# Patient Record
Sex: Female | Born: 1985 | Race: Black or African American | Hispanic: No | Marital: Single | Smoking: Never smoker
Health system: Southern US, Community
[De-identification: ages and names within clinical notes are randomized; demographics above are authoritative.]

## PROBLEM LIST (undated history)

## (undated) DIAGNOSIS — I10 Essential (primary) hypertension: Secondary | ICD-10-CM

## (undated) DIAGNOSIS — D649 Anemia, unspecified: Secondary | ICD-10-CM

---

## 2018-10-31 ENCOUNTER — Encounter (HOSPITAL_COMMUNITY): Payer: Self-pay

## 2018-10-31 ENCOUNTER — Other Ambulatory Visit: Payer: Self-pay

## 2018-10-31 ENCOUNTER — Emergency Department (HOSPITAL_COMMUNITY)
Admission: EM | Admit: 2018-10-31 | Discharge: 2018-10-31 | Disposition: A | Discharge status: Home / Self Care | Payer: Self-pay | Attending: Emergency Medicine | Admitting: Emergency Medicine

## 2018-10-31 ENCOUNTER — Emergency Department (HOSPITAL_COMMUNITY): Payer: Self-pay

## 2018-10-31 DIAGNOSIS — M25572 Pain in left ankle and joints of left foot: Secondary | ICD-10-CM | POA: Insufficient documentation

## 2018-10-31 DIAGNOSIS — I10 Essential (primary) hypertension: Secondary | ICD-10-CM | POA: Insufficient documentation

## 2018-10-31 DIAGNOSIS — G8929 Other chronic pain: Secondary | ICD-10-CM

## 2018-10-31 DIAGNOSIS — M25571 Pain in right ankle and joints of right foot: Secondary | ICD-10-CM | POA: Insufficient documentation

## 2018-10-31 HISTORY — DX: Anemia, unspecified: D64.9

## 2018-10-31 HISTORY — DX: Essential (primary) hypertension: I10

## 2018-10-31 NOTE — ED Notes (Signed)
Reviewed discharge instructions, provided to patient, and she would not sign for discharge.

## 2018-10-31 NOTE — ED Notes (Signed)
Went to go discharge patient, she was upset that the provider, Eustaquio Maize, PA, would prescribe her pain medication besides the pain medication she is already prescribed. Patient requested to speak with the doctor over the physician assistant. Margaux, PA and myself spoke with Dr. Carin Hock about patients request to speak to him and pain medication. Dr. Melina Copa is extremely busy and unable to see patient at this moment. He stated he would not give her pain medication also. Spoke back with patient about the wait for Dr. Melina Copa, she stated she wanted to leave. This writer explained to patient that the emergency department could not prescribe narcotics that was unjustifiable with no no injury from x-rays. Then, patient wanted a taxi home, stating she has Medicaid and needs a ride home. Informed patient we do not provide taxi services for patient unless it is at the expense of the patient. She continues to want to leave but taking her time getting ready. Through out this visit, patient has been negative toward staff and degrading the hospital.
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
## 2018-11-17 ENCOUNTER — Emergency Department: Payer: MEDICAID

## 2018-11-17 ENCOUNTER — Inpatient Hospital Stay
Admit: 2018-11-17 | Discharge: 2018-11-17 | Discharge status: Against Medical Advice | Payer: MEDICAID | Attending: Emergency Medicine

## 2018-11-17 DIAGNOSIS — R102 Pelvic and perineal pain: Secondary | ICD-10-CM

## 2018-11-17 LAB — URINALYSIS W/ RFLX MICROSCOPIC
Bilirubin, Urine: NEGATIVE
Bilirubin: NEGATIVE
Glucose, Ur: NEGATIVE mg/dL
Glucose: NEGATIVE mg/dL
Leukocyte Esterase, Urine: NEGATIVE
Leukocyte Esterase: NEGATIVE
Nitrite, Urine: NEGATIVE
Nitrites: NEGATIVE
Protein, UA: NEGATIVE mg/dL
Protein: NEGATIVE mg/dL
Specific Gravity, UA: >1.03 NA — ABNORMAL HIGH (ref 1.005–1.030)
Specific gravity: >1.03 — ABNORMAL HIGH (ref 1.005–1.030)
Urobilinogen, UA, POCT: 1 EU/dL (ref 0.2–1.0)
Urobilinogen: 1 EU/dL (ref 0.2–1.0)
pH (UA): 5.5 (ref 5.0–8.0)
pH, UA: 5.5 (ref 5.0–8.0)

## 2018-11-17 LAB — CBC WITH AUTO DIFFERENTIAL
Basophils %: 0 % (ref 0–2)
Basophils Absolute: 0 10*3/uL (ref 0.0–0.1)
Eosinophils %: 4 % (ref 0–5)
Eosinophils Absolute: 0.2 10*3/uL (ref 0.0–0.4)
Hematocrit: 36.2 % (ref 35.0–45.0)
Hemoglobin: 11.5 g/dL — ABNORMAL LOW (ref 12.0–16.0)
Lymphocytes %: 30 % (ref 21–52)
Lymphocytes Absolute: 1.8 10*3/uL (ref 0.9–3.6)
MCH: 27.5 PG (ref 24.0–34.0)
MCHC: 31.8 g/dL (ref 31.0–37.0)
MCV: 86.6 FL (ref 74.0–97.0)
MPV: 11.3 FL (ref 9.2–11.8)
Monocytes %: 4 % (ref 3–10)
Monocytes Absolute: 0.2 10*3/uL (ref 0.05–1.2)
Neutrophils %: 62 % (ref 40–73)
Neutrophils Absolute: 3.7 10*3/uL (ref 1.8–8.0)
Platelets: 284 10*3/uL (ref 135–420)
RBC: 4.18 M/uL — ABNORMAL LOW (ref 4.20–5.30)
RDW: 14.7 % — ABNORMAL HIGH (ref 11.6–14.5)
WBC: 5.9 10*3/uL (ref 4.6–13.2)

## 2018-11-17 LAB — COMPREHENSIVE METABOLIC PANEL
ALT: 14 U/L (ref 13–56)
AST: 6 U/L — ABNORMAL LOW (ref 10–38)
Albumin/Globulin Ratio: 0.8 (ref 0.8–1.7)
Albumin: 3.3 g/dL — ABNORMAL LOW (ref 3.4–5.0)
Alkaline Phosphatase: 88 U/L (ref 45–117)
Anion Gap: 3 mmol/L (ref 3.0–18)
BUN: 13 MG/DL (ref 7.0–18)
Bun/Cre Ratio: 20 (ref 12–20)
CO2: 26 mmol/L (ref 21–32)
Calcium: 8.3 MG/DL — ABNORMAL LOW (ref 8.5–10.1)
Chloride: 111 mmol/L (ref 100–111)
Creatinine: 0.66 MG/DL (ref 0.6–1.3)
EGFR IF NonAfrican American: >60 mL/min/{1.73_m2} (ref >60)
GFR African American: >60 mL/min/{1.73_m2} (ref >60)
Globulin: 4.2 g/dL — ABNORMAL HIGH (ref 2.0–4.0)
Glucose: 82 mg/dL (ref 74–99)
Potassium: 3.7 mmol/L (ref 3.5–5.5)
Sodium: 140 mmol/L (ref 136–145)
Total Bilirubin: 0.1 MG/DL — ABNORMAL LOW (ref 0.2–1.0)
Total Protein: 7.5 g/dL (ref 6.4–8.2)

## 2018-11-17 LAB — URINE MICROSCOPIC ONLY
Epithelial Cells, UA: NEGATIVE /lpf (ref 0–5)
Epithelial cells: NEGATIVE /lpf (ref 0–5)
RBC, UA: 0 /hpf (ref 0–5)
RBC: 0 /hpf (ref 0–5)
WBC, UA: NEGATIVE /hpf (ref 0–4)
WBC: NEGATIVE /hpf (ref 0–4)

## 2018-11-17 LAB — HCG QL SERUM
HCG, Ql.: NEGATIVE
HCG, Ql.: NEGATIVE

## 2018-11-17 LAB — WET PREP
Wet Prep: NONE SEEN
Wet prep: NONE SEEN

## 2018-11-17 LAB — CBC WITH AUTOMATED DIFF
ABS. BASOPHILS: 0 10*3/uL (ref 0.0–0.1)
ABS. EOSINOPHILS: 0.2 10*3/uL (ref 0.0–0.4)
ABS. LYMPHOCYTES: 1.8 10*3/uL (ref 0.9–3.6)
ABS. MONOCYTES: 0.2 10*3/uL (ref 0.05–1.2)
ABS. NEUTROPHILS: 3.7 10*3/uL (ref 1.8–8.0)
BASOPHILS: 0 % (ref 0–2)
EOSINOPHILS: 4 % (ref 0–5)
HCT: 36.2 % (ref 35.0–45.0)
HGB: 11.5 g/dL — ABNORMAL LOW (ref 12.0–16.0)
LYMPHOCYTES: 30 % (ref 21–52)
MCH: 27.5 PG (ref 24.0–34.0)
MCHC: 31.8 g/dL (ref 31.0–37.0)
MCV: 86.6 FL (ref 74.0–97.0)
MONOCYTES: 4 % (ref 3–10)
MPV: 11.3 FL (ref 9.2–11.8)
NEUTROPHILS: 62 % (ref 40–73)
PLATELET: 284 10*3/uL (ref 135–420)
RBC: 4.18 M/uL — ABNORMAL LOW (ref 4.20–5.30)
RDW: 14.7 % — ABNORMAL HIGH (ref 11.6–14.5)
WBC: 5.9 10*3/uL (ref 4.6–13.2)

## 2018-11-17 LAB — METABOLIC PANEL, COMPREHENSIVE
A-G Ratio: 0.8 (ref 0.8–1.7)
ALT (SGPT): 14 U/L (ref 13–56)
AST (SGOT): 6 U/L — ABNORMAL LOW (ref 10–38)
Albumin: 3.3 g/dL — ABNORMAL LOW (ref 3.4–5.0)
Alk. phosphatase: 88 U/L (ref 45–117)
Anion gap: 3 mmol/L (ref 3.0–18)
BUN/Creatinine ratio: 20 (ref 12–20)
BUN: 13 MG/DL (ref 7.0–18)
Bilirubin, total: 0.1 MG/DL — ABNORMAL LOW (ref 0.2–1.0)
CO2: 26 mmol/L (ref 21–32)
Calcium: 8.3 MG/DL — ABNORMAL LOW (ref 8.5–10.1)
Chloride: 111 mmol/L (ref 100–111)
Creatinine: 0.66 MG/DL (ref 0.6–1.3)
GFR est AA: >60 mL/min/{1.73_m2} (ref >60)
GFR est non-AA: >60 mL/min/{1.73_m2} (ref >60)
Globulin: 4.2 g/dL — ABNORMAL HIGH (ref 2.0–4.0)
Glucose: 82 mg/dL (ref 74–99)
Potassium: 3.7 mmol/L (ref 3.5–5.5)
Protein, total: 7.5 g/dL (ref 6.4–8.2)
Sodium: 140 mmol/L (ref 136–145)

## 2018-11-17 MED ORDER — MORPHINE 2 MG/ML INJECTION
2 mg/mL | INTRAMUSCULAR | Status: DC
Start: 2018-11-17 — End: 2018-11-17

## 2018-11-17 MED ORDER — IOPAMIDOL 61 % IV SOLN
300 mg iodine /mL (61 %) | Freq: Once | INTRAVENOUS | Status: DC
Start: 2018-11-17 — End: 2018-11-17

## 2018-11-17 MED FILL — MORPHINE 2 MG/ML INJECTION: 2 mg/mL | INTRAMUSCULAR | Qty: 1

## 2018-11-17 MED FILL — ISOVUE-300  61 % INTRAVENOUS SOLUTION: 300 mg iodine /mL (61 %) | INTRAVENOUS | Qty: 100
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
## 2018-11-17 NOTE — ED Notes (Signed)
Patient informed PA Kizzie Bane that she was not staying to complete care. When asked why patient stated she "wasn't gong to stay all night, Ill go somewhere that an do something"

## 2018-11-17 NOTE — ED Notes (Signed)
Patient refusing to provide urine sample. Provider aware.

## 2018-11-17 NOTE — ED Notes (Signed)
Attempted to assist patient to stretcher and into gown. Patient stated that this nurse "have no idea what you talking about. Get that other lady here and leave me the f alone". Provider and charge nurse made aware
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
## 2018-11-17 NOTE — ED Notes (Signed)
Attempted to assist patient to stretcher and into gown. Patient stated that this nurse "have no idea what you talking about. Get that other lady here and leave me the f alone". Provider and charge nurse made aware

## 2018-11-17 NOTE — ED Notes (Signed)
Patient refusing to provide urine sample. Provider aware.

## 2018-11-17 NOTE — ED Notes (Signed)
Patient informed PA Hughes that she was not staying to complete care. When asked why patient stated she "wasn't gong to stay all night, Ill go somewhere that an do something"
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
## 2018-11-21 LAB — C.TRACHOMATIS N.GONORRHOEAE DNA
Chlamydia trachomatis, NAA: NEGATIVE
Neisseria Gonorrhoeae, NAA: NEGATIVE

## 2018-11-21 LAB — CHLAMYDIA/NEISSERIA AMPLIFICATION
Chlamydia amplification: NEGATIVE
N. gonorrhoeae amplification: NEGATIVE

## 2019-03-11 ENCOUNTER — Emergency Department: Admit: 2019-03-11

## 2019-03-11 ENCOUNTER — Inpatient Hospital Stay
Admit: 2019-03-11 | Discharge: 2019-03-11 | Disposition: A | Discharge status: Home / Self Care | Attending: Emergency Medicine

## 2019-03-11 DIAGNOSIS — J45901 Unspecified asthma with (acute) exacerbation: Secondary | ICD-10-CM

## 2019-03-11 MED ORDER — ALBUTEROL SULFATE HFA 90 MCG/ACTUATION AEROSOL INHALER
90 mcg/actuation | RESPIRATORY_TRACT | 0 refills | Status: AC | PRN
Start: 2019-03-11 — End: ?

## 2019-03-11 MED ORDER — LIDOCAINE 5 % (700 MG/PATCH) ADHESIVE PATCH
5 % | CUTANEOUS | 0 refills | Status: AC
Start: 2019-03-11 — End: ?

## 2019-03-11 MED ORDER — ACETAMINOPHEN 500 MG TAB
500 mg | Freq: Once | ORAL | Status: AC
Start: 2019-03-11 — End: 2019-03-11
  Administered 2019-03-11: 10:00:00 via ORAL

## 2019-03-11 MED ORDER — PREDNISONE 50 MG TAB
50 mg | ORAL_TABLET | Freq: Every day | ORAL | 0 refills | Status: AC
Start: 2019-03-11 — End: 2019-03-16

## 2019-03-11 MED ORDER — IPRATROPIUM-ALBUTEROL 2.5 MG-0.5 MG/3 ML NEB SOLUTION
2.5 mg-0.5 mg/3 ml | RESPIRATORY_TRACT | Status: AC
Start: 2019-03-11 — End: 2019-03-11
  Administered 2019-03-11: 10:00:00 via RESPIRATORY_TRACT

## 2019-03-11 MED ORDER — ACETAMINOPHEN 500 MG TAB
500 mg | ORAL_TABLET | Freq: Three times a day (TID) | ORAL | 0 refills | Status: AC | PRN
Start: 2019-03-11 — End: ?

## 2019-03-11 MED ORDER — LIDOCAINE 4 % TOPICAL PATCH (12 HOUR DURATION)
4 % | CUTANEOUS | Status: DC
Start: 2019-03-11 — End: 2019-03-11

## 2019-03-11 MED ORDER — PREDNISONE 20 MG TAB
20 mg | Freq: Once | ORAL | Status: AC
Start: 2019-03-11 — End: 2019-03-11
  Administered 2019-03-11: 10:00:00 via ORAL

## 2019-03-11 MED FILL — ASPERCREME (LIDOCAINE) 4 % TOPICAL PATCH: 4 % | CUTANEOUS | Qty: 1

## 2019-03-11 MED FILL — IPRATROPIUM-ALBUTEROL 2.5 MG-0.5 MG/3 ML NEB SOLUTION: 2.5 mg-0.5 mg/3 ml | RESPIRATORY_TRACT | Qty: 3

## 2019-03-11 MED FILL — MAPAP EXTRA STRENGTH 500 MG TABLET: 500 mg | ORAL | Qty: 2

## 2019-03-11 MED FILL — PREDNISONE 20 MG TAB: 20 mg | ORAL | Qty: 3

## 2019-03-11 NOTE — ED Notes (Signed)
Pt brought in via EMS with c/o "asthma" flare up;  Pt sts her inhaler is not working;  C/o cough for 2 days;  Pt admits to being a smoker;  Pt also c/o left shoulder pain that started when she woke up yesterday;

## 2019-03-11 NOTE — ED Notes (Signed)
Assumed care at discharge  Pt discharged home stable and ambulatory.   Pain level at discharge pt sleeping upon pt being woken up pt sts pain in her neck 8/10.  Pt discharged with self.  Reviewed discharged instructions with pt who verbalized understanding.  Patient armband removed and shredded  Written rx x's 4

## 2019-03-11 NOTE — ED Provider Notes (Signed)
EMERGENCY DEPARTMENT HISTORY AND PHYSICAL EXAM    Date: 03/11/2019  Patient Name: Kristy Lawson    History of Presenting Illness     Chief Complaint   Patient presents with   ??? Wheezing         History Provided By: Patient    Kristy Lawson is a 33 y.o. female with PMHX of asthma who presents to the emergency department C/O wheezing.    Patient tells me she was walking this morning to a friend's house when she began feeling short of breath and wheezing.  She used her inhaler however does not have outpatient psychiatric breath however when she resumed walking she again felt short of breath.  Patient called EMS and they brought her to the emergency department.  She denies prior history of admissions for asthma.  Patient smokes tobacco.  She is also complaining of left shoulder pain she attributes to a ligament injury and thoracic back pain.    PCP: None    Current Facility-Administered Medications   Medication Dose Route Frequency Provider Last Rate Last Dose   ??? lidocaine 4 % patch 1 Patch  1 Patch TransDERmal Q24H Gerarda Gunther, MD   1 Patch at 03/11/19 0981     Current Outpatient Medications   Medication Sig Dispense Refill   ??? albuterol (PROVENTIL HFA, VENTOLIN HFA, PROAIR HFA) 90 mcg/actuation inhaler Take 2 Puffs by inhalation every four (4) hours as needed for Wheezing. 1 Inhaler 0   ??? [START ON 03/12/2019] predniSONE (DELTASONE) 50 mg tablet Take 1 Tab by mouth daily for 4 days. 4 Tab 0   ??? acetaminophen (TYLENOL) 500 mg tablet Take 2 Tabs by mouth every eight (8) hours as needed for Pain. 30 Tab 0   ??? lidocaine (Lidoderm) 5 % Apply patch to the affected area for 12 hours a day and remove for 12 hours a day. 15 Each 0       Past History     Past Medical History:  Past Medical History:   Diagnosis Date   ??? Arthritis    ??? Asthma    ??? Hypertension        Past Surgical History:  History reviewed. No pertinent surgical history.    Family History:  History reviewed. No pertinent family  history.    Social History:  Social History     Tobacco Use   ??? Smoking status: Current Every Day Smoker   ??? Smokeless tobacco: Never Used   Substance Use Topics   ??? Alcohol use: Not on file   ??? Drug use: Not on file       Allergies:  Allergies   Allergen Reactions   ??? Ibuprofen Hives   ??? Penicillins Hives   ??? Tape [Adhesive] Rash         Review of Systems   Review of Systems   Constitutional: Negative for chills and fever.   Respiratory: Positive for cough, chest tightness, shortness of breath and wheezing.    Cardiovascular: Negative for chest pain.   Gastrointestinal: Negative for nausea and vomiting.   Musculoskeletal: Positive for arthralgias (chronic left shoulder apin) and back pain (chronic upper left back pain).   All other systems reviewed and are negative.        Physical Exam     Vitals:    03/11/19 0354 03/11/19 0515 03/11/19 0633   BP: 115/72  118/68   Pulse: 86  78   Resp: 18  16   Temp:  98.6 ??F (37 ??C)     SpO2: 98% 99% 99%   Weight: 80.5 kg (177 lb 6.4 oz)     Height: 5\' 1"  (1.549 m)       Physical Exam  Vitals signs and nursing note reviewed.   Constitutional:       General: She is not in acute distress.     Appearance: Normal appearance. She is well-developed. She is not ill-appearing.      Comments: sleeping   HENT:      Head: Normocephalic and atraumatic.   Eyes:      Extraocular Movements: Extraocular movements intact.      Conjunctiva/sclera: Conjunctivae normal.      Pupils: Pupils are equal, round, and reactive to light.   Neck:      Musculoskeletal: Normal range of motion and neck supple.   Cardiovascular:      Rate and Rhythm: Normal rate and regular rhythm.      Pulses: Normal pulses.      Heart sounds: Normal heart sounds.   Pulmonary:      Effort: Pulmonary effort is normal. No respiratory distress.      Breath sounds: Wheezing present. No rales.      Comments: Scattered mild wheezing  Chest:      Chest wall: No tenderness.   Abdominal:      General: There is no distension.       Palpations: Abdomen is soft.      Tenderness: There is no abdominal tenderness. There is no guarding or rebound.   Musculoskeletal: Normal range of motion.         General: No tenderness.   Lymphadenopathy:      Cervical: No cervical adenopathy.   Skin:     General: Skin is warm and dry.   Neurological:      General: No focal deficit present.      Mental Status: She is alert and oriented to person, place, and time.      Cranial Nerves: No cranial nerve deficit.      Motor: No abnormal muscle tone.      Coordination: Coordination normal.   Psychiatric:         Mood and Affect: Mood normal.         Behavior: Behavior normal.         Diagnostic Study Results     Labs -   No results found for this or any previous visit (from the past 12 hour(s)).    Radiologic Studies -   XR CHEST PORT    (Results Pending)     CT Results  (Last 48 hours)    None        CXR Results  (Last 48 hours)    None          Medications given in the ED-  Medications   lidocaine 4 % patch 1 Patch (1 Patch TransDERmal Apply Patch 03/11/19 0511)   albuterol-ipratropium (DUO-NEB) 2.5 MG-0.5 MG/3 ML (3 mL Nebulization Given 03/11/19 0513)   acetaminophen (TYLENOL) tablet 1,000 mg (1,000 mg Oral Given 03/11/19 0509)   predniSONE (DELTASONE) tablet 60 mg (60 mg Oral Given 03/11/19 0509)         Medical Decision Making   I am the first provider for this patient.    I reviewed the vital signs, available nursing notes, past medical history, past surgical history, family history and social history.    Vital Signs-Reviewed the patient's vital signs.  Pulse Oximetry Analysis and Interpretation:   98% on RA, normal        CXR  interpretation: (Preliminary)  CXR read by Dr. Drenda Freeze    No acute findings      Records Reviewed: Nursing Notes    Provider Notes (Medical Decision Making): Kristy Lawson is a 33 y.o. female here for wheezing, satting adequately on room air lung fields clear we will plan on breathing treatments, steroids anticipate  discharge    Procedures:  Procedures    ED Course:   SMOKING CESSATION:  5:11 AM  The patient was counseled on the dangers of tobacco use, and was advised to quit.  Reviewed strategies to maximize success, including removing cigarettes and smoking materials from environment and written materials. Discussion took 3-5 minutes, and pt expressed understanding.     6:43 AM  Reevaluated patient she has been resting comfortably after medical treatment.  She has absolutely had no respiratory distress and has an unremarkable exam.  Patient is requesting blankets in place to sleep.  She did not provide her demographic information to registration and says she does not remember her Social Security number.  I have referred to [facility] clinic for follow-up in she is safe for discharge      Diagnosis and Disposition     Critical Care:     DISCHARGE NOTE:    Kristy Lawson's  results have been reviewed with her.  She has been counseled regarding her diagnosis, treatment, and plan.  She verbally conveys understanding and agreement of the signs, symptoms, diagnosis, treatment and prognosis and additionally agrees to follow up as discussed.  She also agrees with the care-plan and conveys that all of her questions have been answered.  I have also provided discharge instructions for her that include: educational information regarding their diagnosis and treatment, and list of reasons why they would want to return to the ED prior to their follow-up appointment, should her condition change. She has been provided with education for proper emergency department utilization.     CLINICAL IMPRESSION:    1. Moderate asthma with acute exacerbation, unspecified whether persistent        PLAN:  1. D/C Home  2.   Discharge Medication List as of 03/11/2019  6:25 AM      START taking these medications    Details   albuterol (PROVENTIL HFA, VENTOLIN HFA, PROAIR HFA) 90 mcg/actuation inhaler Take 2 Puffs by inhalation every four (4) hours as needed for  Wheezing., Print, Disp-1 Inhaler,R-0      predniSONE (DELTASONE) 50 mg tablet Take 1 Tab by mouth daily for 4 days., Print, Disp-4 Tab,R-0      acetaminophen (TYLENOL) 500 mg tablet Take 2 Tabs by mouth every eight (8) hours as needed for Pain., Print, Disp-30 Tab,R-0      lidocaine (Lidoderm) 5 % Apply patch to the affected area for 12 hours a day and remove for 12 hours a day., Print, Disp-15 Each,R-0           3.   Follow-up Information     Follow up With Specialties Details Why Contact Info    [facility]  Schedule an appointment as soon as possible for a visit  For primary care follow up [address] [place] [address]  [place] [address]  [phone]        _______________________________      Please note that this dictation was completed with Dragon, the computer voice recognition  software.  Quite often unanticipated grammatical, syntax, homophones, and other interpretive errors are inadvertently transcribed by the computer software.  Please disregard these errors.  Please excuse any errors that have escaped final proofreading.

## 2019-03-11 NOTE — ED Provider Notes (Signed)
EMERGENCY DEPARTMENT HISTORY AND PHYSICAL EXAM    Date: 03/11/2019  Patient Name: Kristy Lawson    History of Presenting Illness     Chief Complaint   Patient presents with   ??? Wheezing         History Provided By: Patient    72  Kristy Lawson is a 33 y.o. female with PMHX of asthma who presents to the emergency department C/O wheezing.    Patient tells me she was walking this morning to a friend's house when she began feeling short of breath and wheezing.  She used her inhaler however does not have outpatient psychiatric breath however when she resumed walking she again felt short of breath.  Patient called EMS and they brought her to the emergency department.  She denies prior history of admissions for asthma.  Patient smokes tobacco.  She is also complaining of left shoulder pain she attributes to a ligament injury and thoracic back pain.    PCP: None    Current Facility-Administered Medications   Medication Dose Route Frequency Provider Last Rate Last Dose   ??? lidocaine 4 % patch 1 Patch  1 Patch TransDERmal Q24H Bill Salinas, MD   1 Patch at 03/11/19 1884     Current Outpatient Medications   Medication Sig Dispense Refill   ??? albuterol (PROVENTIL HFA, VENTOLIN HFA, PROAIR HFA) 90 mcg/actuation inhaler Take 2 Puffs by inhalation every four (4) hours as needed for Wheezing. 1 Inhaler 0   ??? [START ON 03/12/2019] predniSONE (DELTASONE) 50 mg tablet Take 1 Tab by mouth daily for 4 days. 4 Tab 0   ??? acetaminophen (TYLENOL) 500 mg tablet Take 2 Tabs by mouth every eight (8) hours as needed for Pain. 30 Tab 0   ??? lidocaine (Lidoderm) 5 % Apply patch to the affected area for 12 hours a day and remove for 12 hours a day. 15 Each 0       Past History     Past Medical History:  Past Medical History:   Diagnosis Date   ??? Arthritis    ??? Asthma    ??? Hypertension        Past Surgical History:  History reviewed. No pertinent surgical history.    Family History:  History reviewed. No pertinent family history.     Social History:  Social History     Tobacco Use   ??? Smoking status: Current Every Day Smoker   ??? Smokeless tobacco: Never Used   Substance Use Topics   ??? Alcohol use: Not on file   ??? Drug use: Not on file       Allergies:  Allergies   Allergen Reactions   ??? Ibuprofen Hives   ??? Penicillins Hives   ??? Tape [Adhesive] Rash         Review of Systems   Review of Systems   Constitutional: Negative for chills and fever.   Respiratory: Positive for cough, chest tightness, shortness of breath and wheezing.    Cardiovascular: Negative for chest pain.   Gastrointestinal: Negative for nausea and vomiting.   Musculoskeletal: Positive for arthralgias (chronic left shoulder apin) and back pain (chronic upper left back pain).   All other systems reviewed and are negative.        Physical Exam     Vitals:    03/11/19 0354 03/11/19 0515 03/11/19 0633   BP: 115/72  118/68   Pulse: 86  78   Resp: 18  16   Temp:  98.6 ??F (37 ??C)     SpO2: 98% 99% 99%   Weight: 80.5 kg (177 lb 6.4 oz)     Height: 5\' 1"  (1.549 m)       Physical Exam  Vitals signs and nursing note reviewed.   Constitutional:       General: She is not in acute distress.     Appearance: Normal appearance. She is well-developed. She is not ill-appearing.      Comments: sleeping   HENT:      Head: Normocephalic and atraumatic.   Eyes:      Extraocular Movements: Extraocular movements intact.      Conjunctiva/sclera: Conjunctivae normal.      Pupils: Pupils are equal, round, and reactive to light.   Neck:      Musculoskeletal: Normal range of motion and neck supple.   Cardiovascular:      Rate and Rhythm: Normal rate and regular rhythm.      Pulses: Normal pulses.      Heart sounds: Normal heart sounds.   Pulmonary:      Effort: Pulmonary effort is normal. No respiratory distress.      Breath sounds: Wheezing present. No rales.      Comments: Scattered mild wheezing  Chest:      Chest wall: No tenderness.   Abdominal:      General: There is no distension.       Palpations: Abdomen is soft.      Tenderness: There is no abdominal tenderness. There is no guarding or rebound.   Musculoskeletal: Normal range of motion.         General: No tenderness.   Lymphadenopathy:      Cervical: No cervical adenopathy.   Skin:     General: Skin is warm and dry.   Neurological:      General: No focal deficit present.      Mental Status: She is alert and oriented to person, place, and time.      Cranial Nerves: No cranial nerve deficit.      Motor: No abnormal muscle tone.      Coordination: Coordination normal.   Psychiatric:         Mood and Affect: Mood normal.         Behavior: Behavior normal.         Diagnostic Study Results     Labs -   No results found for this or any previous visit (from the past 12 hour(s)).    Radiologic Studies -   XR CHEST PORT    (Results Pending)     CT Results  (Last 48 hours)    None        CXR Results  (Last 48 hours)    None          Medications given in the ED-  Medications   lidocaine 4 % patch 1 Patch (1 Patch TransDERmal Apply Patch 03/11/19 0511)   albuterol-ipratropium (DUO-NEB) 2.5 MG-0.5 MG/3 ML (3 mL Nebulization Given 03/11/19 0513)   acetaminophen (TYLENOL) tablet 1,000 mg (1,000 mg Oral Given 03/11/19 0509)   predniSONE (DELTASONE) tablet 60 mg (60 mg Oral Given 03/11/19 0509)         Medical Decision Making   I am the first provider for this patient.    I reviewed the vital signs, available nursing notes, past medical history, past surgical history, family history and social history.    Vital Signs-Reviewed the patient's vital signs.  Pulse Oximetry Analysis and Interpretation:   98% on RA, normal        CXR  interpretation: (Preliminary)  CXR read by Dr. Verl Blalock    No acute findings      Records Reviewed: Nursing Notes    Provider Notes (Medical Decision Making): Kristy Lawson is a 33 y.o. female here for wheezing, satting adequately on room air lung fields clear  we will plan on breathing treatments, steroids anticipate discharge    Procedures:  Procedures    ED Course:   SMOKING CESSATION:  5:11 AM  The patient was counseled on the dangers of tobacco use, and was advised to quit.  Reviewed strategies to maximize success, including removing cigarettes and smoking materials from environment and written materials. Discussion took 3-5 minutes, and pt expressed understanding.     6:43 AM  Reevaluated patient she has been resting comfortably after medical treatment.  She has absolutely had no respiratory distress and has an unremarkable exam.  Patient is requesting blankets in place to sleep.  She did not provide her demographic information to registration and says she does not remember her Social Security number.  I have referred to [facility] for follow-up in she is safe for discharge      Diagnosis and Disposition     Critical Care:     DISCHARGE NOTE:    Libi Bruemmer's  results have been reviewed with her.  She has been counseled regarding her diagnosis, treatment, and plan.  She verbally conveys understanding and agreement of the signs, symptoms, diagnosis, treatment and prognosis and additionally agrees to follow up as discussed.  She also agrees with the care-plan and conveys that all of her questions have been answered.  I have also provided discharge instructions for her that include: educational information regarding their diagnosis and treatment, and list of reasons why they would want to return to the ED prior to their follow-up appointment, should her condition change. She has been provided with education for proper emergency department utilization.     CLINICAL IMPRESSION:    1. Moderate asthma with acute exacerbation, unspecified whether persistent        PLAN:  1. D/C Home  2.   Discharge Medication List as of 03/11/2019  6:25 AM      START taking these medications    Details   albuterol (PROVENTIL HFA, VENTOLIN HFA, PROAIR HFA) 90 mcg/actuation  inhaler Take 2 Puffs by inhalation every four (4) hours as needed for Wheezing., Print, Disp-1 Inhaler,R-0      predniSONE (DELTASONE) 50 mg tablet Take 1 Tab by mouth daily for 4 days., Print, Disp-4 Tab,R-0      acetaminophen (TYLENOL) 500 mg tablet Take 2 Tabs by mouth every eight (8) hours as needed for Pain., Print, Disp-30 Tab,R-0      lidocaine (Lidoderm) 5 % Apply patch to the affected area for 12 hours a day and remove for 12 hours a day., Print, Disp-15 Each,R-0           3.   Follow-up Information     Follow up With Specialties Details Why Contact Info    [facility]  Schedule an appointment as soon as possible for a visit  For primary care follow up [place], [place]  [phone]        _______________________________      Please note that this dictation was completed with Dragon, the computer voice recognition  software.  Quite often unanticipated grammatical, syntax, homophones, and other interpretive errors are inadvertently transcribed by the computer software.  Please disregard these errors.  Please excuse any errors that have escaped final proofreading.

## 2019-03-11 NOTE — ED Notes (Signed)
Assumed care at discharge  Pt discharged home stable and ambulatory.   Pain level at discharge pt sleeping upon pt being woken up pt sts pain in her neck 8/10.  Pt discharged with self.  Reviewed discharged instructions with pt who verbalized understanding.  Patient armband removed and shredded  Written rx x's 4

## 2019-03-11 NOTE — ED Notes (Signed)
Pt brought in via EMS with c/o "asthma" flare up;  Pt sts her inhaler is not working;  C/o cough for 2 days;  Pt admits to being a smoker;  Pt also c/o left shoulder pain that started when she woke up yesterday;
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
## 2020-06-25 ENCOUNTER — Emergency Department: Payer: PRIVATE HEALTH INSURANCE

## 2020-06-25 DIAGNOSIS — R0602 Shortness of breath: Secondary | ICD-10-CM

## 2020-06-25 NOTE — Unmapped (Signed)
Patient presents to ED with complaints of shortness of breath and anxiety. Patient appears in no acute distress at this time.

## 2020-06-25 NOTE — Unmapped (Signed)
ED Attending Attestation Note    Date of service:  06/25/2020    This patient was seen by the resident physician.  I have seen and examined the patient, agree with the workup, evaluation, management and diagnosis. The care plan has been discussed and I concur.  I have reviewed the ECG and concur with the resident's interpretation.    My assessment reveals a 35 y.o. female presents to the emergency room today complaining of 1 month of a cough with associated intermittent chest pains shortness of breath.  Patient states this started off with some congestion and runny nose sore throat with voice changes.  On exam she has some faint expiratory wheezes noted throughout all lung fields more pronounced in the upper lungs.  She has no lower extremity swelling, no audible murmurs rubs or gallops, equal pulses in all extremities.  She is speaking in full sentences without difficulty posterior oropharynx is unremarkable in appearance.Marland Kitchen

## 2020-06-25 NOTE — Unmapped (Signed)
[facility] ED Note    Date of service:  06/25/2020    Reason for Visit: Anxiety and Shortness of Breath      Patient History     HPI:  Caroline [place] is a 35 y.o. female with PMHx of asthma who presents with a chief complaint of chest pain shortness of breath.     Patient reports approximately 1 month of persistent chest pressure/pain, with associated shortness of breath and a cough productive of yellow/green sputum.  Patient states that she has tried everything possible to alleviate the symptoms, to no avail.  Currently, the patient rates her discomfort as 10/10 in severity, with no modifying factors.  Patient states that she has been using her albuterol inhaler more frequently, without improvement.  Patient notes that her last menstrual period was approximately 1 to 2 months ago, and that she is currently late, however denies any recent sexual activity.  She denies any nausea, vomiting, abdominal pain, hematuria, dysuria, or any other acute concerns.    Aside from the above, patient denies any aggravating or alleviating factors or associated symptoms.       Past Medical History:   Diagnosis Date   ??? Asthma        No past surgical history on file.    Caroline [place]  does not have a smoking history on file. She has never used smokeless tobacco. She reports that she does not drink alcohol. No history on file for drug use.    There are no discharge medications for this patient.      Allergies:   Allergies as of 06/25/2020   ??? (Not on File)       Review of Systems     ROS:  Pertinent positives include chest pain, dyspnea, anxiety, cough. Pertinent negatives include abdominal pain, hematuria, dysuria, vaginal bleeding or discharge, back pain, headache, lightheadedness, fever, rash. A comprehensive review of systems was performed and negative except as noted previously    Physical Exam     ED Triage Vitals   Vital Signs Group      Temp       Temp src       Pulse        Heart Rate Source       Resp       SpO2       BP       MAP (mmHg)       BP Location       BP Method       Patient Position    SpO2    O2 Device        Constitutional:  35 y.o. female, well nourished; well developed; in no apparent distress   HEENT:  Normocephalic, atraumatic. Mucous membranes are moist.   Eyes: Anicteric, PERRL, EOMI   Neck:  Supple, Full ROM, trachea midline   Pulmonary:   Mild end expiratory wheezing; no rales or rhonchi, normal respiratory effort  Cardiac:  Regular rate and rhythm.  No murmurs/rubs/gallops.   Abdomen:  Soft, non-tender, non-distended.   Extremities: 2+ radial pulses. No extremity deformity, swelling or tenderness appreciated.   Skin:  No rashes or bruising.  Neuro:  Alert and oriented x3. Speech normal. Moves UE and LE spontaneously and symmetrically   Psych:  Mood and affect appropriate for situation.     Diagnostic Studies     Labs:    Please see electronic medical record for any tests performed in the ED  Radiology:    Please see electronic medical record for any tests performed in the ED    EKG:    Indication: Chest pain,   Rate: 83,   Rhythm: Normal sinus rhythm,   Intervals and Conduction: Normal,   Axis: Normal,   ST Segment Change: None  Comparison to prior EKG: No previous EKG available for comparison    Emergency Department Procedures     Procedures      ED Course and MDM     Caroline [place] is a 35 y.o. female with a history and presentation as described above in HPI.  The patient was evaluated by myself, the ED Attending Physician, Dr. Bonnetta Barry att. providers found and the R4 Dr. Luster Landsberg. All management and disposition plans were discussed and agreed upon. Appropriate labs and diagnostic studies were reviewed as they were made available. Pertinent laboratory studies in medical decision making are listed below.     Upon presentation, the patient was mildly anxious appearing, however not acutely toxic, afebrile and hemodynamically stable.    Patient was given a  bronchodilator panel including ipratropium and albuterol, with improvement of her shortness of breath.    Screening labs were obtained including metabolic panel which shows a glucose of 115, otherwise unremarkable.  CBC unremarkable.  High-sensitivity troponin undetectable and EKG without ischemic changes.  BNP within normal limits at 24.  Chest x-ray shows no acute cardiopulmonary abnormalities.       Chest x-ray was obtained which demonstrated no findings concerning for pneumonia or pneumothorax. The patient does not describe a sensation of tearing chest pain radiating to her back, and no signs of mediastinal widening are seen on CXR to raise suspicion for aortic dissection. The chest pain is not worse with leaning forward, there is no friction rub on auscultation, and EKG does not demonstrate diffuse ST elevations or PR depressions concerning for pericarditis. There are no ST elevations to indicate myocardial infarction. Furthermore, the patient's troponin level is undetectable. Pulmonay embolus is considered unlikely based on the patient's history and absence of tachycardia, hypoxia, or tachypnea. Further evaluation of PE was felt to be unnecessary as the [facility] criteria were not met. ACS is unlikely given the lack of risk factors and based on the history and description of symptoms. Esophageal rupture is unlikely given the patient's history, including a lack of recent violent vomiting or retching and a lack of recent esophageal instrumentation.    Regarding the patient's laboratory evaluation, CBC demonstrates no leukocytosis. CBC demonstrates no anemia. The patient is not pregnant.Marland Kitchen    HEART score:   History: slightly suspicious (0)   EKG: normal (0)   Age: <45 (0)   Risk factors: no known risk factors (0) (smoking)   Troponin: < normal limit (0)  Total: 0: 0.9-1.7% risk of 6-week MACE    PERC Criteria:   Age > 50: No   HR > 100: No   SaO2 RA < 95%: No   Unilateral leg swelling: No   Hemoptysis:  No   Surgery/trauma < 4w ago: No   Prior PE/DVT: No   Hormone use: No      Risks, benefits, and alternatives were discussed. At this time the patient has been deemed safe for discharge. My customary discharge instructions including strict return precautions for worsening or new symptoms have been communicated.    Summary of Treatment in ED:    Medications   ipratropium-albuteroL (DUO-NEB) 0.5 mg-3 mg(2.5 mg base)/3 mL nebulizer solution 3 mL (3 mLs Nebulization  Given 06/26/20 0138)     Followed by   albuterol (PROVENTIL) nebulizer solution 2.5 mg (0 mg Nebulization Hold 06/26/20 0139)     Followed by   albuterol (PROVENTIL) nebulizer solution 2.5 mg (0 mg Nebulization Hold 06/26/20 0139)           Impression     1. Shortness of breath    2. Chest pain, unspecified type           Delmer Islam, MD  PGY-1 Emergency Medicine    This note was dictated using voice-recognition software, which occasionally leads to inadvertent typographic errors.     Delmer Islam, MD  Resident  06/26/20 [phone]

## 2020-06-26 ENCOUNTER — Emergency Department: Admit: 2020-06-26 | Payer: PRIVATE HEALTH INSURANCE

## 2020-06-26 ENCOUNTER — Inpatient Hospital Stay
Admit: 2020-06-26 | Discharge: 2020-06-26 | Disposition: A | Discharge status: Home / Self Care | Payer: PRIVATE HEALTH INSURANCE

## 2020-06-26 LAB — DIFFERENTIAL
Basophils Absolute: 28 /uL (ref 0–200)
Basophils Relative: 0.4 % (ref 0.0–1.0)
Eosinophils Absolute: 304 /uL (ref 15–500)
Eosinophils Relative: 4.4 % (ref 0.0–8.0)
Lymphocytes Absolute: 1808 /uL (ref 850–3900)
Lymphocytes Relative: 26.2 % (ref 15.0–45.0)
Monocytes Absolute: 518 /uL (ref 200–950)
Monocytes Relative: 7.5 % (ref 0.0–12.0)
Neutrophils Absolute: 4244 /uL (ref 1500–7800)
Neutrophils Relative: 61.5 % (ref 40.0–80.0)
nRBC: 0 /100 WBC (ref 0–0)

## 2020-06-26 LAB — BASIC METABOLIC PANEL
Anion Gap: 8 mmol/L (ref 3–16)
BUN: 15 mg/dL (ref 7–25)
CO2: 25 mmol/L (ref 21–33)
Calcium: 9.4 mg/dL (ref 8.6–10.3)
Chloride: 106 mmol/L (ref 98–110)
Creatinine: 0.91 mg/dL (ref 0.60–1.30)
EGFR: 85
Glucose: 115 mg/dL (ref 70–100)
Osmolality, Calculated: 290 mOsm/kg (ref 278–305)
Potassium: 4 mmol/L (ref 3.5–5.3)
Sodium: 139 mmol/L (ref 133–146)

## 2020-06-26 LAB — HIGH SENSITIVITY TROPONIN: High Sensitivity Troponin: <3 ng/L (ref 0–14)

## 2020-06-26 LAB — CBC
Hematocrit: 36.5 % (ref 35.0–45.0)
Hemoglobin: 11.8 g/dL (ref 11.7–15.5)
MCH: 26.8 pg (ref 27.0–33.0)
MCHC: 32.3 g/dL (ref 32.0–36.0)
MCV: 83.2 fL (ref 80.0–100.0)
MPV: 9.2 fL (ref 7.5–11.5)
Platelets: 241 10*3/uL (ref 140–400)
RBC: 4.39 10*6/uL (ref 3.80–5.10)
RDW: 14.7 % (ref 11.0–15.0)
WBC: 6.9 10*3/uL (ref 3.8–10.8)

## 2020-06-26 LAB — B NATRIURETIC PEPTIDE: BNP: 24 pg/mL (ref 0–100)

## 2020-06-26 LAB — HCG URINE, QUALITATIVE: hCG Qualitative -Clinitek: NEGATIVE

## 2020-06-26 MED ORDER — albuterol (PROVENTIL) nebulizer solution 2.5 mg
2.5 | Freq: Once | RESPIRATORY_TRACT | Status: AC
Start: 2020-06-26 — End: 2020-06-26

## 2020-06-26 MED ORDER — ipratropium-albuteroL (DUO-NEB) 0.5 mg-3 mg(2.5 mg base)/3 mL nebulizer solution 3 mL
0.5 | Freq: Once | RESPIRATORY_TRACT | Status: AC
Start: 2020-06-26 — End: 2020-06-26
  Administered 2020-06-26: 07:00:00 3 mL via RESPIRATORY_TRACT

## 2020-06-26 MED FILL — ALBUTEROL SULFATE 2.5 MG/3 ML (0.083 %) SOLUTION FOR NEBULIZATION: 2.5 2.5 mg /3 mL (0.083 %) | RESPIRATORY_TRACT | Qty: 3

## 2020-06-26 MED FILL — IPRATROPIUM 0.5 MG-ALBUTEROL 3 MG (2.5 MG BASE)/3 ML NEBULIZATION SOLN: 0.5 0.5 mg-3 mg(2.5 mg base)/3 mL | RESPIRATORY_TRACT | Qty: 3

## 2020-06-26 NOTE — Unmapped (Signed)
Pt able to urinate.  PCA in to collect urine and send for processing.

## 2020-06-26 NOTE — Unmapped (Signed)
Pt comes in with chest tightness/SOB/cough.  Pt states her chest hurts when she coughs.  She has a hx of asthma, has been using her inhaler today (pt states at least 6 times) without relief.  Pt states I just want you all to figure out whats wrong with me. VSS and no acute distress.  Pt appeared anxious on arrival but calmed quickly.  EKG complete, PIV started, CMU with consistent monitoring.  No further needs identified at this time.  Bed locked in low position, call light within reach.  Will continue to monitor.

## 2020-06-26 NOTE — Unmapped (Addendum)
You were evaluated in the Emergency Department today for chest pain. Your evaluation has shown no signs of medical conditions requiring emergent intervention at this time, however we recommend that you follow up with your primary care physician or your cardiologist as soon as possible for further testing as an outpatient.    Please schedule an appointment for follow up with your primary care physician as soon as possible.    Return to the Emergency Department if you experience worsening or uncontrolled chest pain, shortness of breath, light headedness, feeling faint, nausea, vomiting, or any other concerning symptoms.    Thank you for choosing us for your care.

## 2020-06-26 NOTE — Unmapped (Addendum)
Pt stating she does not want the covid/flu test.  Pt tells RN I can't have anything up my nose it will make me puke.  PIV removed by another nurse because pt was uncomfortable with it.  MD updated and aware.  Preg test switched to urine test since pt does not have PIV anymore. Pt provided juice.  Will notify RN via call light when able to urinate.

## 2021-01-18 ENCOUNTER — Emergency Department: Admit: 2021-01-18 | Payer: PRIVATE HEALTH INSURANCE

## 2021-01-18 ENCOUNTER — Inpatient Hospital Stay
Admit: 2021-01-18 | Discharge: 2021-01-18 | Disposition: A | Discharge status: Home / Self Care | Payer: PRIVATE HEALTH INSURANCE | Attending: Emergency Medicine

## 2021-01-18 DIAGNOSIS — J9811 Atelectasis: Secondary | ICD-10-CM

## 2021-01-18 LAB — COMPREHENSIVE METABOLIC PANEL
ALT: 16 U/L (ref 10–40)
AST: 16 IU/L (ref 15–37)
Albumin: 4.2 GM/DL (ref 3.4–5.0)
Alkaline Phosphatase: 85 IU/L (ref 40–129)
Anion Gap: NEGATIVE (ref 4–16)
BUN: 16 MG/DL (ref 6–23)
CO2: 38 MMOL/L — ABNORMAL HIGH (ref 21–32)
Calcium: 9.1 MG/DL (ref 8.3–10.6)
Chloride: 99 mMol/L (ref 99–110)
Creatinine: 0.8 MG/DL (ref 0.6–1.1)
GFR African American: >60 mL/min/{1.73_m2} (ref >60)
GFR Non-African American: >60 mL/min/{1.73_m2} (ref >60)
Glucose: 88 MG/DL (ref 70–99)
Potassium: 3.8 MMOL/L (ref 3.5–5.1)
Sodium: 135 MMOL/L (ref 135–145)
Total Bilirubin: 0.2 MG/DL (ref 0.0–1.0)
Total Protein: 7.7 GM/DL (ref 6.4–8.2)

## 2021-01-18 LAB — MAGNESIUM: Magnesium: 2.2 mg/dl (ref 1.8–2.4)

## 2021-01-18 LAB — CBC
Hematocrit: 41.1 % (ref 37–47)
Hemoglobin: 12.9 GM/DL (ref 12.5–16.0)
MCH: 27.2 PG (ref 27–31)
MCHC: 31.4 % — ABNORMAL LOW (ref 32.0–36.0)
MCV: 86.5 FL (ref 78–100)
MPV: 10.8 FL (ref 7.5–11.1)
Platelets: 251 10*3/uL (ref 140–440)
RBC: 4.75 10*6/uL (ref 4.2–5.4)
RDW: 14.1 % (ref 11.7–14.9)
WBC: 7.6 10*3/uL (ref 4.0–10.5)

## 2021-01-18 LAB — EKG 12-LEAD
Atrial Rate: 64 {beats}/min
Diagnosis: NORMAL
P Axis: 39 degrees
P-R Interval: 138 ms
Q-T Interval: 418 ms
QRS Duration: 74 ms
QTc Calculation (Bazett): 431 ms
R Axis: 47 degrees
T Axis: 35 degrees
Ventricular Rate: 64 {beats}/min

## 2021-01-18 LAB — TROPONIN: Troponin T: <0.01 NG/ML (ref <0.01)

## 2021-01-18 LAB — LIPASE: Lipase: 23 IU/L (ref 13–60)

## 2021-01-18 LAB — HCG, SERUM, QUALITATIVE: hCG Qual: NEGATIVE

## 2021-01-18 MED ORDER — ACETAMINOPHEN 325 MG PO TABS
325 MG | Freq: Once | ORAL | Status: DC
Start: 2021-01-18 — End: 2021-01-18

## 2021-01-18 MED ORDER — SODIUM CHLORIDE 0.9 % IV BOLUS
0.9 % | Freq: Once | INTRAVENOUS | Status: AC
Start: 2021-01-18 — End: 2021-01-18
  Administered 2021-01-18: 06:00:00 1000 mL via INTRAVENOUS

## 2021-01-18 MED FILL — ACETAMINOPHEN 325 MG PO TABS: 325 MG | ORAL | Qty: 2

## 2021-01-18 MED FILL — SODIUM CHLORIDE 0.9 % IV SOLN: 0.9 % | INTRAVENOUS | Qty: 1000

## 2021-01-18 NOTE — ED Notes (Signed)
This nurse attempted to collect urine. Patient states she is too drowsy to walk     Kristy Lawson, Kristy Lawson  01/18/21 0249

## 2021-01-18 NOTE — ED Triage Notes (Signed)
Patient presents to ED via [facility]. Per medics patient call them d/t feeling of dizziness, weakness, and headache. On arrival patient states she was walking from her significant others house in [place] to [place] as she did not have a ride. Patient stated she was walking when these symptoms started. Patient arouses to voice but is alert and oriented at this time and able to follow all commands. This Nurse attempted to ask patient questions regarding her current condition but patient unable to answer d/t drowsiness, but patient was able to request warm blankets and tell this RN she was allergic to Tegaderm when leaving room.

## 2021-01-18 NOTE — Discharge Instructions (Signed)
Establish and follow-up with a primary care physician for reevaluation.  Call for an appointment  Increase p.o. fluids to prevent dehydration  Take Tylenol alternate with Motrin for pain aches or fevers  Return to the emergency department immediately increased pain fever chills nausea vomiting or worsening symptoms.

## 2021-01-18 NOTE — ED Provider Notes (Signed)
Emergency Department Encounter    Patient: Kristy Lawson  MRN: [id number]  DOB: 1985-12-24  Date of Evaluation: 01/18/2021  ED Provider:  Valerie Roys, DO    Triage Chief Complaint:   Dizziness, Fatigue, and Headache    HOPI:  Kristy Lawson is a 35 y.o. female that presents to the emergency department for worsening dizziness weakness fatigue.  Patient states she was walking from [place] to [place] where she lives.  Patient states she is leaving her boyfriend's house.  She states he was unable to walk with her because he has seizures.  Patient states while she was walking she got dizzy and lightheaded.  Patient states a walk is about 2 and half hours.  Patient states no falls injuries or trauma.  Patient states no drugs or alcohol.  Patient admits to tobacco use and she vapes.  Patient states she is having headache.  She denies any shortness breath nausea vomiting diarrhea abdominal pain dysuria hematuria no sore throat runny nose earache fever chills.  Patient says she does have a cough history of asthma uses inhalers.  Denies any history of migraines has not taken anything for the headache.  Per EMS call while standing out in front of our emergency department to get a ride to [place].  They were unable to take patient to [facility] brought her here to this emergency department.  When speaking with patient she states she is unable to get a ride home to [place].    ROS - see HPI, below listed is current ROS at time of my eval:  General:  No fevers, no chills, positive for fatigue, weakness  Eyes:  No recent vison changes, no discharge  ENT:  No sore throat, no nasal congestion, no hearing changes  Cardiovascular:  No chest pain, no palpitations  Respiratory:  No shortness of breath, no cough, no wheezing  Gastrointestinal:  No pain, no nausea, no vomiting, no diarrhea  Musculoskeletal:  No muscle pain, no joint pain  Skin:  No rash, no pruritis, no easy bruising  Neurologic: Positive for dizziness, no  speech problems, positive for headache, no extremity numbness, no extremity tingling, no extremity weakness  Psychiatric:  No anxiety  Genitourinary:  No dysuria, no hematuria  Endocrine:  No unexpected weight gain, no unexpected weight loss  Extremities:  no edema, no pain    No past medical history on file.  No past surgical history on file.  No family history on file.  Social History     Socioeconomic History    Marital status: Single     Spouse name: Not on file    Number of children: Not on file    Years of education: Not on file    Highest education level: Not on file   Occupational History    Not on file   Tobacco Use    Smoking status: Every Day     Types: Cigarettes    Smokeless tobacco: Never   Vaping Use    Vaping Use: Every day    Substances: Nicotine    Devices: Disposable   Substance and Sexual Activity    Alcohol use: Not on file    Drug use: Not Currently    Sexual activity: Not Currently   Other Topics Concern    Not on file   Social History Narrative    Not on file     Social Determinants of Health     Financial Resource Strain: Not on file   Food Insecurity:  Not on file   Transportation Needs: Not on file   Physical Activity: Not on file   Stress: Not on file   Social Connections: Not on file   Intimate Partner Violence: Not on file   Housing Stability: Not on file     Current Facility-Administered Medications   Medication Dose Route Frequency Provider Last Rate Last Admin    acetaminophen (TYLENOL) tablet 650 mg  650 mg Oral Once [employer], DO        0.9 % sodium chloride bolus  1,000 mL IntraVENous Once Quinterrius Errington Fortunato Curling, DO         Current Outpatient Medications   Medication Sig Dispense Refill    albuterol sulfate HFA (PROVENTIL;VENTOLIN;PROAIR) 108 (90 Base) MCG/ACT inhaler Inhale 1-2 puffs into the lungs every 4 hours as needed       Allergies   Allergen Reactions    Ibuprofen Anaphylaxis, Hives, Nausea Only and Itching    Penicillins Anaphylaxis, Hives and Itching    Wound  Dressings Rash       Nursing Notes Reviewed    Physical Exam:  Triage VS:    ED Triage Vitals   Enc Vitals Group      BP 01/18/21 0155 126/78      Heart Rate 01/18/21 0155 73      Resp 01/18/21 0155 14      Temp 01/18/21 0155 97.8 ??F (36.6 ??C)      Temp Source 01/18/21 0155 Infrared      SpO2 01/18/21 0155 99 %      Weight 01/18/21 0157 195 lb 14.4 oz (88.9 kg)      Height 01/18/21 0157 5\' 1"  (1.549 m)      Head Circumference --       Peak Flow --       Pain Score --       Pain Loc --       Pain Edu? --       Excl. in GC? --        My pulse ox interpretation is - normal    General appearance:  No acute distress.   Skin:  Warm. Dry.   Eye:  Extraocular movements intact.     Ears, nose, mouth and throat:  Oral mucosa moist   Neck:  Trachea midline.   Extremity:  No swelling.  Normal ROM     Heart:  Regular rate and rhythm, normal S1 & S2, no extra heart sounds.    Perfusion:  intact  Respiratory:  Lungs clear to auscultation bilaterally.  Respirations nonlabored.     Abdominal:  Normal bowel sounds.  Soft.  Nontender.  Non distended.  Back:  No CVA tenderness to palpation     Neurological:  Alert and oriented times 3.  No focal neuro deficits.             Psychiatric:  Appropriate    I have reviewed and interpreted all of the currently available lab results from this visit (if applicable):  Results for orders placed or performed during the hospital encounter of 01/18/21   CBC   Result Value Ref Range    WBC 7.6 4.0 - 10.5 K/CU MM    RBC 4.75 4.2 - 5.4 M/CU MM    Hemoglobin 12.9 12.5 - 16.0 GM/DL    Hematocrit 01/20/21 37 - 47 %    MCV 86.5 78 - 100 FL    MCH 27.2 27 - 31 PG  MCHC 31.4 (L) 32.0 - 36.0 %    RDW 14.1 11.7 - 14.9 %    Platelets 251 140 - 440 K/CU MM    MPV 10.8 7.5 - 11.1 FL   Comprehensive Metabolic Panel   Result Value Ref Range    Sodium 135 135 - 145 MMOL/L    Potassium 3.8 3.5 - 5.1 MMOL/L    Chloride 99 99 - 110 mMol/L    CO2 38 (H) 21 - 32 MMOL/L    BUN 16 6 - 23 MG/DL    Creatinine 0.8 0.6 - 1.1  MG/DL    Glucose 88 70 - 99 MG/DL    Calcium 9.1 8.3 - 84.6 MG/DL    Albumin 4.2 3.4 - 5.0 GM/DL    Total Protein 7.7 6.4 - 8.2 GM/DL    Total Bilirubin 0.2 0.0 - 1.0 MG/DL    ALT 16 10 - 40 U/L    AST 16 15 - 37 IU/L    Alkaline Phosphatase 85 40 - 129 IU/L    GFR Non-African American >60 >60 mL/min/1.43m2    GFR African American >60 >60 mL/min/1.41m2    Anion Gap NEG 2 4 - 16   Troponin   Result Value Ref Range    Troponin T <0.010 <0.01 NG/ML   Magnesium   Result Value Ref Range    Magnesium 2.2 1.8 - 2.4 mg/dl   Lipase   Result Value Ref Range    Lipase 23 13 - 60 IU/L   HCG Serum, Qualitative   Result Value Ref Range    hCG Qual NEGATIVE    EKG 12 Lead   Result Value Ref Range    Ventricular Rate 64 BPM    Atrial Rate 64 BPM    P-R Interval 138 ms    QRS Duration 74 ms    Q-T Interval 418 ms    QTc Calculation (Bazett) 431 ms    P Axis 39 degrees    R Axis 47 degrees    T Axis 35 degrees    Diagnosis       Normal sinus rhythm  Normal ECG  No previous ECGs available        Radiographs (if obtained):  Radiologist's Report Reviewed:  XR CHEST PORTABLE   Preliminary Result   1. Low lung volumes with basilar atelectasis.               EKG (if obtained): (All EKG's are interpreted by myself in the absence of a cardiologist)      MDM:  Patient presents emergency department complaint of dizziness headache weakness fatigue.  Patient states she is walking from [place] to [place].  Patient called 911 to get a ride back to [place] to her boyfriend's house they were unable to accommodate this patient.  Patient arrives here in no acute distress neurological intact no focal weakness and deficit on exam.  vital signs are normal upon arrival.  Patient ordered some basic labs IV fluids Tylenol for headache urinalysis.  I discussed with patient once laboratory studies are back we will go over and if negative will be discharged home.  Laboratory studies have been unremarkable for any acute process.  Chest x-ray is basilar  atelectasis.  EKG normal sinus rhythm no ST elevation or depression.  I discussed with patient she is to follow-up and establish her primary care physician.  patient is to return immediately worsening symptoms.  All questions answered.  Clinical Impression:  1. Dizziness  2. General weakness    3. Acute nonintractable headache, unspecified headache type      Disposition referral (if applicable):  Jacqulynn Cadet, MD  [address]  [place] [address]  [phone]    Schedule an appointment as soon as possible for a visit in 2 days  To establish a primary care physician.  Call for an appointment    [facility]  [address]  [place] [address]  [phone]  Go in 1 day  If symptoms worsen  Disposition medications (if applicable):  New Prescriptions    No medications on file     ED Provider Disposition Time  DISPOSITION  3:41 AM        Comment: Please note this report has been produced using speech recognition software and may contain errors related to that system including errors in grammar, punctuation, and spelling, as well as words and phrases that may be inappropriate.  Efforts were made to edit the dictations.       Valerie Roys, DO  01/18/21 [phone]

## 2023-02-12 NOTE — External Documentation (Signed)
Encounter Department: [facility] EMERGENCY      ED Provider Notes by Carlynn Spry, DO at 02/12/2023  8:25 AM   Author: Carlynn Spry, DO Service: - Author Type: ED Physician   Filed: 02/13/2023  6:32 AM Date of Service: 02/12/2023  8:25 AM Status: Signed   Editor: Carlynn Spry, DO (ED Physician)    I saw and evaluated the patient and directed the plan of care. I have discussed with the resident,  Dr. Chaney Malling, and agree with the findings and plan.      FINAL IMPRESSION(S)     ICD-10-CM  1. Thoracic myofascial strain, initial encounter  S29.019A    DISCHARGE MEDICATION(S) / CHANGES TO HOME MEDICATIONS    Discharge Medication List as of 02/12/2023  8:25 AM      ===================================================================    CHIEF COMPLAINT  Chief Complaint  Patient presents with     - Flank Pain   Right flank pain    TRIAGE NOTE: Celesta Gentile, RN  02/12/2023  7:02 AM  Signed  Pt arrived via EMS today from home with right flank pain for the last 28 hours. Pt recalls one  other possible time she had this pain roughly a year ago. Aox4 GCS   15.    HPI  Patient presented with right posterior thoracic pain.  Reports similar episode several years ago.  No anterior chest pain or abdominal pain    KEY PORTION OF PHYSICAL EXAM  ED Triage Vitals  BP 02/12/23 0711 114/90  Temp 02/12/23 0711 97.8 ?F (36.6 ?C)  Pulse 02/12/23 0711 77  Resp 02/12/23 0711 16  SpO2 02/12/23 0711 100 %  Weight --  Glasgow Coma Scale Score 02/12/23 0824 15  BMI (Calculated) --    Heart rate rhythm regular lungs clear no anterior chest tenderness right thoracic paraspinal  musculature and latissimus tender and spasmed    EKG  Results for orders placed or performed during the hospital encounter of 02/12/23  EKG Standard 12 Lead  Result Value Ref Range   Heart Rate 63 bpm   RR INTERVAL 956 ms   PR Interval 140 ms   QRSD Interval 80 ms   QT Interval 397 ms   QTc Interval 406 ms   QRS Axis 70 deg   T Wave  Axis 55 deg   REPORT - NORMAL ECG -   REPORT Sinus rhythm   Interpreting Phys   Confirmed byElonda Husky (DO) 12-Feb-2023 08:46:58    RADIOLOGY  The radiologist interpretation reveals:  Results for orders placed or performed during the hospital encounter of 02/12/23    XR-CHEST PORTABLE STAT   Narrative    XR-CHEST PORTABLE STAT  DATE OF SERVICE:  02/12/2023 7:23 AM  CLINICAL INDICATION:  chest pain  PATIENT INFORMATION: History:  chest pain. Number of Series/Images:   1.    COMPARISON: 08/27/2021  TECHNIQUE: AP portable upright projection  FINDINGS:  No consolidations or  effusions are identified. The heart and mediastinum are not enlarged. The trachea lies midline. The  apices are clear. The lungs are well pneumatized without focal areas of infiltrate or atelectasis.  The skeletal structures appear appropriate for age.   Impression   No abnormalities are seen on today's study.  Electronically Signed by: Berdie Ogren, DO,  02/12/2023 7:26 AM    LAB RESULTS    URINE CULTURE, CONDITIONAL - Abnormal; Notable for the following components:      Result Value Ref Range Status  Urine Clarity Turbid (*) Clear Final   Specific Gravity Urinalysis-IrisCell 1.037 (*) 1.001 - 1.035 Final   Leukocytes Urinalysis Trace (*) Negative Leu/uL Final   WBC-IrisCell 4-5 (*) 0 - 3 /hpf Final   Bacteria-IrisCell Trace (*) Negative /hpf Final   Squamous Epi-IrisCell 10-20 (*) 0 - 3 /hpf Final    All other components within normal limits     Narrative:   Culture of the urine specimen was not completed as criteria were not met.  BASIC METABOLIC PANEL - Abnormal; Notable for the following components:   Anion Gap 6 (*) 7 - 16 mmol/L Final    All other components within normal limits     Narrative:   KDIGO 2012 GFR Categories  Stage Description    eGFR (mL/min/1.38m2)  G1 Normal or high    >=90 G2  Mildly decreased   60-89 G3a Mildly to moderately decreased 45-59 G3b Moderately to severely  decreased 30-44 G4 Severely decreased   15-29 G5  Kidney Failure    <15  CBC W/DIFF - Abnormal; Notable for the following components:   MCH 27.6 (*) 28.4 - 33.4 pg Final    All other components within normal limits    HS TROPONIN I - Normal   Narrative:   The Access high sensitivity troponin assay is not intended to be used in isolation; results should  be interpreted in conjunction with other diagnostic tests and clinical information.  POC URINE PREGNANCY - Normal   Narrative:   Internal Controls Acceptable. Point of care test performed at bedside.  POCT ED URINE PREGNANCY    DIFFERENTIAL DIAGNOSIS / MEDICAL DECISION MAKING/ED COURSE  Repeat Vitals:  BP: 99/67 (10/19 0817)  Temp: 97.8 ?F (36.6 ?C) (10/19 5409)  Pulse: 77 (10/19 0711)  Resp: 16 (10/19 0711)  SpO2: 99 % (10/19 0817)  FiO2 (%): --  O2 Flow Rate (L/min): --  Cardiac (WDL): --  Cardiac Rhythm: --  Medications  ketorolac (TORADOL) injection 15 mg (15 mg IV Push Given 02/12/23 0748)  methocarbamoL (ROBAXIN) injection 1,000 mg (1,000 mg Intravenous Given 02/12/23 0750)    PERC negative.  No suspicion for aortic pathology.  Low risk by ED ACS for any anginal cause.  This  is highly consistent with musculoskeletal cause of her pain.  Complete relief with medications.    Electronically  signed by: Carlynn Spry, DO, 02/13/2023      Carlynn Spry, [place]  02/13/23 [phone]

## 2023-02-12 NOTE — External Documentation (Signed)
Encounter Department: [facility] EMERGENCY      ED Provider Notes by [facility], DO at 02/12/2023  6:59 AM   Author: Osvaldo Shipper, DO Service: Emergency Medicine Author Type: Resident   Filed: 02/12/2023  8:26 AM Date of Service: 02/12/2023  6:59 AM Status: Signed   Editor: Osvaldo Shipper, DO (Resident) Cosigner: Carlynn Spry, DO at 02/12/2023 10:07 AM      TRIAGE NOTE:  Celesta Gentile, RN  02/12/2023  7:02 AM  Signed  Pt arrived via EMS today from home with right flank pain for the last 28 hours. Pt recalls one  other possible time she had this pain roughly a year ago. Aox4 GCS   15.    CHIEF COMPLAINT  Chief Complaint  Patient presents with     - Flank Pain   Right flank pain    HPI    Caroline Palmer is a 37 y.o. female with a past medical history significant for asthma who presents  via EMS with concern for right-sided back pain.  Pain is located inferior to the shoulder blade and  causes difficulty with range of motion of the shoulder.  Patient states her pain has been ongoing  since yesterday morning after she woke up.  Patient believes she may have slept wrong on her arm.  Pain does not radiate.  Has not tried anything for pain.  Denies fever, cough, shortness of breath.    Additional history and source includes EMS    PAST MEDICAL HISTORY  Past Medical History:  Diagnosis Date     - Anemia     - Anxiety     - Asthma     - Depression     - Migraine     - PTSD (post-traumatic stress disorder)    FAMILY HISTORY  No family history on file.  SOCIAL HISTORY  Social History    Socioeconomic History     - Marital status: Significant Other  Tobacco Use     - Smoking status: Never     - Smokeless tobacco: Never  Vaping Use     - Vaping status: Never Used  Substance and Sexual Activity     - Alcohol use: Never     - Drug use: Never     - Sexual activity: Not Currently    SURGICAL HISTORY  Past Surgical History:  Procedure Laterality Date     - CESAREAN SECTION    CURRENT  MEDICATIONS  No current facility-administered medications for this encounter.    Current Outpatient Medications  Medication Sig Dispense Refill     - albuterol 90 mcg/actuation inhaler Inhale 1 puff into the lungs every 6 hours as needed for  Wheezing. 1 each 0     - benzonatate (TESSALON) 100 mg capsule Take 1 capsule (100 mg total) by mouth 3 times a day as  needed for Cough. 20 capsule 0     - promethazine (PHENERGAN) 25 mg tablet Take 1 tablet (25 mg total) by mouth every 6 hours as  needed for Nausea. 15 tablet 0    ALLERGIES  Allergies  Allergen Reactions     - Ibuprofen   Hives     - Other   cherries     - Penicillins   Hives     - Tegaderm Ag Mesh [Silver]   Hives    PHYSICAL EXAM  VITAL SIGNS: BP 99/67   Pulse 77   Temp 97.8 ?F (36.6 ?C)  Resp 16   SpO2 99%    Constitutional: Alert and oriented x   3. No acute distress.    HENT: Atraumatic. Normocephalic.  Eyes: PERRL. EOMI.  Neck: Normal range of motion. Supple. No midline or paraspinal tenderness.  Cardiovascular: Regular rate.  Regular rhythm. Peripheral pulses palpable.  Thorax AND Lungs: No respiratory distress.  Normal respiratory effort.  Anterior right sided chest  wall tenderness.  Skin: Warm and dry. No rash or erythema on exposed skin.  Back: No CVA tenderness.  Tenderness to palpation over the right inferior scapula and paraspinal  muscles of the thoracic spine.  Extremities: No tenderness. No edema. No cyanosis. No clubbing.  Musculoskeletal: No major deformities noted. Moving all extremities appropriately.    EKG  Results for orders placed or performed during the hospital encounter of 02/12/23  EKG Standard 12 Lead  Result Value Ref Range   Heart Rate 63 bpm   RR INTERVAL 956 ms   PR Interval 140 ms   QRSD Interval 80 ms   QT Interval 397 ms   QTc Interval 406 ms   QRS Axis 70 deg   T Wave Axis 55 deg   REPORT - NORMAL ECG -   REPORT Sinus rhythm    LABS    URINE CULTURE, CONDITIONAL - Abnormal; Notable for the following components:       Result Value Ref Range Status   Urine Clarity Turbid (*) Clear Final   Specific Gravity Urinalysis-IrisCell 1.037 (*) 1.001 - 1.035 Final   Leukocytes Urinalysis Trace (*) Negative Leu/uL Final   WBC-IrisCell 4-5 (*) 0 - 3 /hpf Final   Bacteria-IrisCell Trace (*) Negative /hpf Final   Squamous Epi-IrisCell 10-20 (*) 0 - 3 /hpf Final    All other components within normal limits     Narrative:   Culture of the urine specimen was not completed as criteria were not met.  BASIC METABOLIC PANEL - Abnormal; Notable for the following components:   Anion Gap 6 (*) 7 - 16 mmol/L Final    All other components within normal limits     Narrative:   KDIGO 2012 GFR Categories                                  Stage Description    eGFR  (mL/min/1.25m2)                                  G1 Normal or high    >=90                 G2  Mildly decreased   60-89                 G3a Mildly to moderately decreased 45-59  G3b Moderately to severely decreased 30-44                 G4 Severely decreased   15-29       G5 Kidney Failure    <15  CBC W/DIFF - Abnormal; Notable for the following components:   MCH 27.6 (*) 28.4 - 33.4 pg Final    All other components within normal limits    HS TROPONIN I - Normal   Narrative:   The Access high sensitivity troponin assay is not intended to be used in isolation; results should  be interpreted in  conjunction with other diagnostic tests and clinical information.  POC URINE PREGNANCY - Normal   Narrative:   Internal Controls Acceptable.                 Point of care test performed at bedside.  URINE CULTURE, CONDITIONAL, NOT PERFORMED  POCT ED URINE PREGNANCY    RADIOLOGY  I have personally visualized imaging studies.  My preliminary interpretation: Portable chest x-ray with no focal consolidation, pneumothorax, or  pleural effusion; cardiomediastinal silhouette appears within normal limits.    Radiology interpretation:  Last Imaging results  Results for orders placed or performed during the hospital  encounter of 02/12/23    XR-CHEST PORTABLE STAT   Narrative    XR-CHEST PORTABLE STAT  DATE OF SERVICE:  02/12/2023 7:23 AM  CLINICAL INDICATION:  chest pain  PATIENT INFORMATION: History:  chest pain. Number of Series/Images:   1.    COMPARISON: 08/27/2021  TECHNIQUE: AP portable upright projection  FINDINGS:  No consolidations or  effusions are identified. The heart and mediastinum are not enlarged. The trachea lies midline. The  apices are clear. The lungs are well pneumatized without focal areas of infiltrate or atelectasis.  The skeletal structures appear appropriate for age.   Impression   No abnormalities are seen on today's study.  Electronically Signed by: Berdie Ogren, DO,  02/12/2023 7:26 AM    I have personally reviewed the radiologist interpretations.    ED COURSE AND MEDICAL DECISION MAKING    Medications  ketorolac (TORADOL) injection 15 mg (15 mg IV Push Given 02/12/23 0748)  methocarbamoL (ROBAXIN) injection 1,000 mg (1,000 mg Intravenous Given 02/12/23 0750)    Pertinent labs AND imaging studies reviewed (see chart for details). The patient was staffed with and  examined by Dr. Sandre Kitty who directed the work-up, evaluation and medical decision-making and  disposition of the patient.    37 y.o. female who presents for evaluation of right-sided back and flank pain. Initial vital signs  are within normal limits. Differential diagnosis includes MSK pain, ACS, UTI. Secondary to symptoms  and exam findings obtained CBC, BMP, troponin, EKG, chest x-ray, UA, pregnancy test. Patient  administered Toradol and Robaxin.    EKG with sinus rhythm, no ischemic changes or malignant dysrhythmias.  Chest x-ray with no acute  abnormality.  CBC and BMP unremarkable.    On re-evaluation patient is in no acute distress and resting comfortably in the hospital bed.  Shared decision making utilized to discuss laboratory results, imaging studies, and treatment  plan.Treatment plan will be to discharge patient home.   Recommended Tylenol and ibuprofen as needed  for pain relief.  Patient was instructed to follow-up with a primary care physician and to return  to the emergency department with any worsening symptoms. All questions and concerns addressed.    Treatment plan discussed with my attending physician as well as patient. Patient verbalizes  understanding and feels plan to be appropriate and satisfactory. At time of final disposition,  patient is in stable condition.    Comorbid Conditions Impacting Patient's Clinical Course and Management:     - Asthma    Social Determinants Impacting Patient's Clinical Course and Management:     - Lack of PCP-information given to establish care    Relevant Findings from Review of Patient's MEDICAL RECORD NUMBER    - None      FINAL IMPRESSION   ICD-10-CM  1. Thoracic myofascial strain, initial encounter  S29.019A      DISPOSITION PLAN     -  Discharge      Electronically  signed by: Nile Riggs, DO PGY2 Emergency Medicine  02/12/2023 8:25 AM    Osvaldo Shipper, DO  Resident  02/12/23 [phone]
